# Patient Record
Sex: Female | Born: 1955 | Race: White | Hispanic: No | Marital: Married | Smoking: Never smoker
Health system: Southern US, Community
[De-identification: ages and names within clinical notes are randomized; demographics above are authoritative.]

## PROBLEM LIST (undated history)

## (undated) DIAGNOSIS — Z1239 Encounter for other screening for malignant neoplasm of breast: Secondary | ICD-10-CM

## (undated) DIAGNOSIS — N6019 Diffuse cystic mastopathy of unspecified breast: Secondary | ICD-10-CM

## (undated) DIAGNOSIS — N63 Unspecified lump in unspecified breast: Secondary | ICD-10-CM

## (undated) HISTORY — DX: Encounter for other screening for malignant neoplasm of breast: Z12.39

## (undated) HISTORY — DX: Unspecified lump in unspecified breast: N63.0

## (undated) HISTORY — DX: Diffuse cystic mastopathy of unspecified breast: N60.19

---

## 1989-05-29 HISTORY — PX: DILATION AND CURETTAGE OF UTERUS: SHX78

## 2005-05-29 HISTORY — PX: CHOLECYSTECTOMY: SHX55

## 2005-06-06 ENCOUNTER — Ambulatory Visit: Payer: Self-pay | Admitting: General Surgery

## 2005-06-09 ENCOUNTER — Ambulatory Visit: Payer: Self-pay | Admitting: General Surgery

## 2005-11-21 ENCOUNTER — Ambulatory Visit: Payer: Self-pay | Admitting: Unknown Physician Specialty

## 2005-12-06 ENCOUNTER — Ambulatory Visit: Payer: Self-pay | Admitting: Unknown Physician Specialty

## 2006-06-02 IMAGING — NM NUCLEAR MEDICINE HEPATOHBILIARY INCLUDE GB
2 series · 12 of 12 positions shown · non-contrast
Comparison: none

REASON FOR EXAM: RUQ pain     US([REDACTED])
COMMENTS:

[Series 1: gallbladder dynamic · 4.80mm/px · 6 of 60 frames shown]
[frame 6/60]
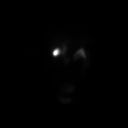
[frame 16/60]
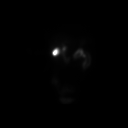
[frame 26/60]
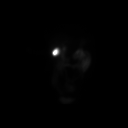
[frame 36/60]
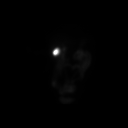
[frame 46/60]
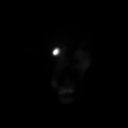
[frame 56/60]
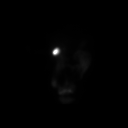

[Series 1: gallbladder dynamic (results) · 4.80mm/px · 6 of 60 frames shown]
[frame 6/60]
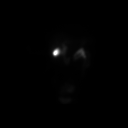
[frame 16/60]
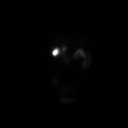
[frame 26/60]
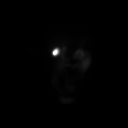
[frame 36/60]
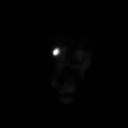
[frame 46/60]
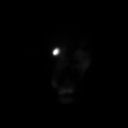
[frame 56/60]
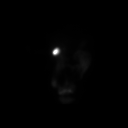

[12 of 12 positions shown; findings below may reference images not displayed]

PROCEDURE:     NM  - NM HEPATO WITH GB EJECT FRACTION  - June 06, 2005 [DATE]

RESULT:     Patient received and injection of 7.58 mCi of Technetium 99m
Choletec.  There is prompt extraction of tracer from the blood pool by the
liver.  Small bowel activity is present by 15 minutes.  Gallbladder activity
is first seen by 25 minutes and increases through the study.  A continuous
infusion of sincalide was initiated and extended over 30 minutes.  A dose of
1.1 micrograms of sincalide was administered.  The gallbladder ejection
fraction is negligible measured at 2%.
IMPRESSION: Normal appearing hepatobiliary scan with an abnormal response to sincalide
infusion with a gallbladder ejection fraction only measured at 2%.
Gallbladder dysfunction should be considered and correlated clinically.  No
recent ultrasound is available for comparison.

## 2006-11-26 ENCOUNTER — Ambulatory Visit: Payer: Self-pay | Admitting: Obstetrics and Gynecology

## 2007-12-02 ENCOUNTER — Ambulatory Visit: Payer: Self-pay | Admitting: Obstetrics and Gynecology

## 2008-12-07 ENCOUNTER — Ambulatory Visit: Payer: Self-pay | Admitting: Obstetrics and Gynecology

## 2009-12-21 ENCOUNTER — Ambulatory Visit: Payer: Self-pay | Admitting: Obstetrics and Gynecology

## 2010-05-29 DIAGNOSIS — Z1239 Encounter for other screening for malignant neoplasm of breast: Secondary | ICD-10-CM

## 2010-05-29 HISTORY — DX: Encounter for other screening for malignant neoplasm of breast: Z12.39

## 2010-12-27 ENCOUNTER — Ambulatory Visit: Payer: Self-pay | Admitting: Obstetrics and Gynecology

## 2011-05-30 DIAGNOSIS — N63 Unspecified lump in unspecified breast: Secondary | ICD-10-CM

## 2011-05-30 HISTORY — DX: Unspecified lump in unspecified breast: N63.0

## 2012-01-02 ENCOUNTER — Ambulatory Visit: Payer: Self-pay | Admitting: Obstetrics and Gynecology

## 2012-03-08 HISTORY — PX: BREAST SURGERY: SHX581

## 2012-07-27 ENCOUNTER — Encounter: Payer: Self-pay | Admitting: *Deleted

## 2012-07-27 DIAGNOSIS — N63 Unspecified lump in unspecified breast: Secondary | ICD-10-CM | POA: Insufficient documentation

## 2012-09-12 ENCOUNTER — Ambulatory Visit: Payer: Self-pay | Admitting: General Surgery

## 2013-06-25 ENCOUNTER — Encounter: Payer: Self-pay | Admitting: General Surgery

## 2013-06-25 ENCOUNTER — Ambulatory Visit (INDEPENDENT_AMBULATORY_CARE_PROVIDER_SITE_OTHER): Payer: BC Managed Care – PPO | Admitting: General Surgery

## 2013-06-25 ENCOUNTER — Other Ambulatory Visit: Payer: BC Managed Care – PPO

## 2013-06-25 VITALS — BP 112/60 | HR 78 | Resp 12 | Ht 63.0 in | Wt 127.0 lb

## 2013-06-25 DIAGNOSIS — N63 Unspecified lump in unspecified breast: Secondary | ICD-10-CM

## 2013-06-25 DIAGNOSIS — N6009 Solitary cyst of unspecified breast: Secondary | ICD-10-CM

## 2013-06-25 NOTE — Progress Notes (Signed)
Patient ID: Julia Mays, female   DOB: 06/16/1955, 58 y.o.   MRN: [id number]  Chief Complaint  Patient presents with  . Follow-up    left breast cyst    HPI Julia Mays Devoss is a 58 y.o. female.  who presents for a breast evaluation. The most recent 3D mammogram was done on 01-14-13. Ultrasound completed at that time. Patient does perform regular self breast checks and gets regular mammograms done.    Left breast mass underneath the nipple noticed 2-3 months ago, maybe a little larger. Tender with direct pressure.  The patient had undergone left breast cyst aspiration in August 2013.  In October 2013 the patient underwent right breast biopsy for bloody nipple drainage with findings of benign breast tissue with stromal sclerosis and focal hemorrhage.  [place]on[place] 6th  2015 [place]  HPI  Past Medical History  Diagnosis Date  . 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 Breast screening, unspecified 2012  . Lump or mass in breast 2013    right breast, finesse biopsy done-0900 cyst, 1100 nodule    Past Surgical History  Procedure Laterality Date  . Cholecystectomy  2007  . Cesarean section  [phone],1995  . Dilation and curettage of uterus  1991  . Breast surgery Right 03/08/2012    right breast finesse biopsy for nodule at 1100 and cyst at 0900. benign breast tissue with stromal sclerosis and focal hemorrhage with associated hemosiderin macrophages, Negative for atypia and malignancy    Family History  Problem Relation Age of Onset  . Diabetes Father     Social History History  Substance Use Topics  . Smoking status: Never Smoker   . Smokeless tobacco: Not on file  . Alcohol Use: Yes    No Known Allergies  Current Outpatient Prescriptions  Medication Sig Dispense Refill  . desogestrel-ethinyl estradiol (KARIVA,AZURETTE,MIRCETTE) 0.15-0.02/0.01 MG (21/5) tablet Take 1 tablet by mouth daily.      Marland Kitchen. MOVIPREP 100 G SOLR        No  current facility-administered medications for this visit.    Review of Systems Review of Systems  Blood pressure 112/60, pulse 78, resp. rate 12, height 5\' 3"  (1.6 m), weight 127 lb (57.607 kg), last menstrual period 06/04/2013.  Physical Exam Physical Exam  Constitutional: She is oriented to person, place, and time. She appears well-developed and well-nourished.  Neck: Neck supple.  Cardiovascular: Normal rate, regular rhythm and normal heart sounds.   Pulmonary/Chest: Effort normal and breath sounds normal. Right breast exhibits no inverted nipple, no mass, no nipple discharge, no skin change and no tenderness. Left breast exhibits mass and tenderness. Left breast exhibits no inverted nipple, no nipple discharge and no skin change.  Left breast > right breast 2 cm nodule at 12-1 o'clock smooth and mobile, tender to palpate  Lymphadenopathy:    She has no cervical adenopathy.    She has no axillary adenopathy.  Neurological: She is alert and oriented to person, place, and time.  Skin: Skin is warm and dry.    Data Reviewed Bilateral mammogram dated January 14, 2013 showed dense breast tissue with a 2 cm smoothly marginated mass behind the left nipple. BI-RAD-0.  Ultrasound examination dated January 14, 2013 showed a simple cyst in the 2:00 position of the left breast. BI-RAD-2.  Ultrasound examination of the left breast at the 1:00 position the retroareolar area showed a fairly thickwalled cyst measuring 2.1 x 2.2 x 2.5 cm. This is similar in  size to that noted at the time of her August 2014 exam, although the cyst wall thickness is new. Immediately superior to this was a small 0.65 x 0.6 x 0.72 hypoechoic nodule with faint acoustic enhancement. This had smooth borders.  The patient was amenable to aspiration. The cyst resolved completely with return of 10 cc of thin brown fluid. The cavity was filled with air to discourage recurrence.  The lesion at the 12:00 position was aspirated  making use of a 22 gauge needle with near complete resolution. Thick material was returned and slides x4 prepared. Some stinging discomfort was appreciated during the procedure in spite of the use of 1% Xylocaine plain. This had resolved by the time the patient was leaving the office.  Assessment    Left retroareolar cyst, recently symptomatic. Likely desiccated cyst left breast.     Plan    The patient will be contacted with a cytology report is available.        Julia Mays 06/25/2013, 8:38 PM

## 2013-06-25 NOTE — Patient Instructions (Signed)
Continue self breast exams. Call office for any new breast issues or concerns. 

## 2013-06-27 LAB — FINE-NEEDLE ASPIRATION

## 2013-07-07 ENCOUNTER — Encounter: Payer: Self-pay | Admitting: General Surgery

## 2014-03-30 ENCOUNTER — Encounter: Payer: Self-pay | Admitting: General Surgery
